# Patient Record
Sex: Female | Born: 1970 | Race: White | Hispanic: No | Marital: Married | Smoking: Former smoker
Health system: Southern US, Community
[De-identification: ages and names within clinical notes are randomized; demographics above are authoritative.]

## PROBLEM LIST (undated history)

## (undated) DIAGNOSIS — F32A Depression, unspecified: Secondary | ICD-10-CM

## (undated) DIAGNOSIS — E859 Amyloidosis, unspecified: Secondary | ICD-10-CM

## (undated) DIAGNOSIS — F419 Anxiety disorder, unspecified: Secondary | ICD-10-CM

## (undated) HISTORY — PX: TONSILLECTOMY: SUR1361

---

## 2020-02-19 DIAGNOSIS — D487 Neoplasm of uncertain behavior of other specified sites: Secondary | ICD-10-CM | POA: Insufficient documentation

## 2020-04-08 DIAGNOSIS — E8581 Light chain (AL) amyloidosis: Secondary | ICD-10-CM | POA: Insufficient documentation

## 2022-03-27 ENCOUNTER — Other Ambulatory Visit: Payer: Self-pay

## 2022-03-27 ENCOUNTER — Encounter (HOSPITAL_COMMUNITY): Payer: Self-pay | Admitting: Emergency Medicine

## 2022-03-27 DIAGNOSIS — S8392XA Sprain of unspecified site of left knee, initial encounter: Secondary | ICD-10-CM | POA: Diagnosis not present

## 2022-03-27 DIAGNOSIS — M25552 Pain in left hip: Secondary | ICD-10-CM | POA: Diagnosis not present

## 2022-03-27 DIAGNOSIS — M549 Dorsalgia, unspecified: Secondary | ICD-10-CM | POA: Diagnosis not present

## 2022-03-27 DIAGNOSIS — S8992XA Unspecified injury of left lower leg, initial encounter: Secondary | ICD-10-CM | POA: Diagnosis present

## 2022-03-27 DIAGNOSIS — Z91040 Latex allergy status: Secondary | ICD-10-CM | POA: Insufficient documentation

## 2022-03-27 DIAGNOSIS — W1830XA Fall on same level, unspecified, initial encounter: Secondary | ICD-10-CM | POA: Diagnosis not present

## 2022-03-27 NOTE — ED Triage Notes (Signed)
Patient c/o multiple sites of pain after falling today. Patient states left foot went though weak spot on wooden step. Per fell backward while leg was in step and hit left hip and right mid. Per husband patient's landed right back fell metal anchor. CNS intact. Denies any complications with BM or urination.

## 2022-03-28 ENCOUNTER — Emergency Department (HOSPITAL_COMMUNITY)
Admission: EM | Admit: 2022-03-28 | Discharge: 2022-03-28 | Disposition: A | Discharge status: Home / Self Care | Payer: BC Managed Care – PPO | Attending: Emergency Medicine | Admitting: Emergency Medicine

## 2022-03-28 ENCOUNTER — Emergency Department (HOSPITAL_COMMUNITY): Payer: BC Managed Care – PPO

## 2022-03-28 DIAGNOSIS — S8392XA Sprain of unspecified site of left knee, initial encounter: Secondary | ICD-10-CM

## 2022-03-28 HISTORY — DX: Amyloidosis, unspecified: E85.9

## 2022-03-28 HISTORY — DX: Depression, unspecified: F32.A

## 2022-03-28 HISTORY — DX: Anxiety disorder, unspecified: F41.9

## 2022-03-28 MED ORDER — IBUPROFEN 800 MG PO TABS: 800.0000 mg | ORAL_TABLET | Freq: Four times a day (QID) | ORAL | 0 refills | Status: AC | PRN

## 2022-03-28 NOTE — ED Notes (Signed)
Patient transported to X-ray 

## 2022-03-28 NOTE — ED Provider Notes (Signed)
Stacey Randall Provider Note   CSN: [id number] Arrival date & time: 03/27/22  1713     History  Chief Complaint  Patient presents with   Lytle Michaels    Stacey Randall is a 51 y.o. female.  Patient presents to the emergency department for evaluation of leg and back injury.  Patient reports that she stepped onto a wooden step and her leg fell through the step causing her to fall backwards.  She then hit the center of her mid back on a metal object that was on the ground.  Patient complaining of left knee, left hip and back pain after the fall.  No head injury.       Home Medications Prior to Admission medications   Medication Sig Start Date End Date Taking? Authorizing Provider  ibuprofen (ADVIL) 800 MG tablet Take 1 tablet (800 mg total) by mouth every 6 (six) hours as needed for moderate pain. 03/28/22  Yes Jamice Carreno, Gwenyth Allegra, MD  triamterene-hydrochlorothiazide (MAXZIDE-25) 37.5-25 MG tablet Take 0.5-1 tablets by mouth daily as needed. 02/04/20  Yes [provider]  Ascorbic Acid (VITAMIN C) 1000 MG tablet Take by mouth.    [provider]  Biotin 1 MG CAPS Take by mouth.    [provider]  cephALEXin (KEFLEX) 500 MG capsule Take 500 mg by mouth 2 (two) times daily. 10/08/21   [provider]  citalopram (CELEXA) 20 MG tablet Take 20 mg by mouth daily. 02/25/22   [provider]  topiramate (TOPAMAX) 25 MG tablet Take 25 mg by mouth at bedtime. 02/25/22   [provider]      Allergies    Erythromycin, Latex, Coly-mycin s [colistin], E.p. mycin [oxytetracycline], and Hydrocortisone    Review of Systems   Review of Systems  Physical Exam Updated Vital Signs BP 121/63   Pulse 68   Temp 98.3 F (36.8 C) (Oral)   Resp 16   Ht 5' 9.5" (1.765 m)   Wt 117.9 kg   SpO2 100%   BMI 37.84 kg/m  Physical Exam Vitals and nursing note reviewed.  Constitutional:      General: She is not in acute distress.     Appearance: She is well-developed.  HENT:     Head: Normocephalic and atraumatic.     Mouth/Throat:     Mouth: Mucous membranes are moist.  Eyes:     General: Vision grossly intact. Gaze aligned appropriately.     Extraocular Movements: Extraocular movements intact.     Conjunctiva/sclera: Conjunctivae normal.  Cardiovascular:     Rate and Rhythm: Normal rate and regular rhythm.     Pulses: Normal pulses.     Heart sounds: Normal heart sounds, S1 normal and S2 normal. No murmur heard.    No friction rub. No gallop.  Pulmonary:     Effort: Pulmonary effort is normal. No respiratory distress.     Breath sounds: Normal breath sounds.  Abdominal:     General: Bowel sounds are normal.     Palpations: Abdomen is soft.     Tenderness: There is no abdominal tenderness. There is no guarding or rebound.     Hernia: No hernia is present.  Musculoskeletal:        General: No swelling.     Cervical back: Full passive range of motion without pain, normal range of motion and neck supple. No spinous process tenderness or muscular tenderness. Normal range of motion.       Back:  Left hip: Tenderness present. No deformity. Normal range of motion.     Left knee: No swelling, deformity, effusion, erythema or ecchymosis. Decreased range of motion. Tenderness present. No patellar tendon tenderness. Normal alignment. Normal pulse.     Instability Tests: Anterior drawer test negative. Posterior drawer test negative.     Right lower leg: No edema.     Left lower leg: No edema.  Skin:    General: Skin is warm and dry.     Capillary Refill: Capillary refill takes less than 2 seconds.     Findings: No ecchymosis, erythema, rash or wound.  Neurological:     General: No focal deficit present.     Mental Status: She is alert and oriented to person, place, and time.     GCS: GCS eye subscore is 4. GCS verbal subscore is 5. GCS motor subscore is 6.     Cranial Nerves: Cranial nerves 2-12 are intact.      Sensory: Sensation is intact.     Motor: Motor function is intact.     Coordination: Coordination is intact.  Psychiatric:        Attention and Perception: Attention normal.        Mood and Affect: Mood normal.        Speech: Speech normal.        Behavior: Behavior normal.     ED Results / Procedures / Treatments   Labs (all labs ordered are listed, but only abnormal results are displayed) Labs Reviewed - No data to display  EKG None  Radiology CT Thoracic Spine Wo Contrast  Addendum Date: 03/28/2022   ADDENDUM REPORT: 03/28/2022 05:50 ADDENDUM: There should also be an Impression #3 which reads: 3. Heterogeneously and enlarged thyroid. Recommend thyroid ultrasound (ref: J Am Coll Radiol. 2015 Feb;12(2): 143-50). Electronically Signed   By: Genevie Ann M.D.   On: 03/28/2022 05:50   Result Date: 03/28/2022 CLINICAL DATA:  51 year old female status post fall with pain. Suspicion of acute lower thoracic anterior wedge compression on lumbar radiographs today. EXAM: CT THORACIC SPINE WITHOUT CONTRAST TECHNIQUE: Multidetector CT images of the thoracic were obtained using the standard protocol without intravenous contrast. RADIATION DOSE REDUCTION: This exam was performed according to the departmental dose-optimization program which includes automated exposure control, adjustment of the mA and/or kV according to patient size and/or use of iterative reconstruction technique. COMPARISON:  Lumbar radiographs 03/28/2022. FINDINGS: Limited cervical spine imaging: Cervicothoracic junction alignment is within normal limits. Thoracic spine segmentation:  Normal. Alignment: Mild dextroconvex thoracic kyphoscoliosis. No significant thoracic spondylolisthesis. Vertebrae: Mild anterior wedging of T11 appears to be chronic and/or congenital. Otherwise maintained thoracic vertebral body height throughout. Mild thoracic endplate degeneration T4 through T12. No acute thoracic vertebral fracture identified. L1 vertebra  also appears intact. Visible posterior ribs appear intact. Paraspinal and other soft tissues: Thoracic paraspinal soft tissues are within normal limits. Heterogeneous enlargement of the thyroid, greater on the right. Negative visible noncontrast mediastinum. Questionable small gastric hiatal hernia. No evidence of pericardial or pleural effusion. Visible major airways are patent. Visible lungs are clear with mild respiratory motion. Negative visible noncontrast upper abdominal viscera. Normal caliber of the visible aorta in the chest and abdomen. Disc levels: Thoracic kyphoscoliosis and lower thoracic disc degeneration with vacuum disc, but no CT evidence of significant thoracic spinal stenosis. IMPRESSION: 1. No convincing acute traumatic injury identified in the thoracic spine. Suspect chronic and/or congenital mild anterior wedging of T11. If there is ongoing suspicion of occult compression  fracture then noncontrast Thoracic MRI or Nuclear Medicine Whole-body Bone Scan would evaluate with the highest sensitivity. 2. Thoracic kyphoscoliosis with some disc and endplate degeneration but no CT evidence of significant thoracic spinal stenosis. Electronically Signed: By: Genevie Ann M.D. On: 03/28/2022 05:10   DG Hip Unilat W or Wo Pelvis 2-3 Views Left  Result Date: 03/28/2022 CLINICAL DATA:  Fall and trauma to the left lower extremity. EXAM: DG HIP (WITH OR WITHOUT PELVIS) 2-3V LEFT; LEFT KNEE - COMPLETE 4+ VIEW COMPARISON:  None Available. FINDINGS: There is no evidence of hip fracture or dislocation. There is no evidence of arthropathy or other focal bone abnormality. IMPRESSION: Negative. Electronically Signed   By: Anner Crete M.D.   On: 03/28/2022 02:12   DG Knee Complete 4 Views Left  Result Date: 03/28/2022 CLINICAL DATA:  Fall and trauma to the left lower extremity. EXAM: DG HIP (WITH OR WITHOUT PELVIS) 2-3V LEFT; LEFT KNEE - COMPLETE 4+ VIEW COMPARISON:  None Available. FINDINGS: There is no  evidence of hip fracture or dislocation. There is no evidence of arthropathy or other focal bone abnormality. IMPRESSION: Negative. Electronically Signed   By: Anner Crete M.D.   On: 03/28/2022 02:12   DG Lumbar Spine Complete  Result Date: 03/28/2022 CLINICAL DATA:  Pain after a fall. EXAM: LUMBAR SPINE - COMPLETE 4+ VIEW COMPARISON:  None Available. FINDINGS: Five lumbar type vertebral bodies. Normal alignment of the lumbar spine. Mild degenerative changes with disc space narrowing and small osteophyte formation. There is anterior compression of the T11 vertebra, likely acute in the given clinical setting. No apparent retropulsion of fracture fragments. Visualized sacrum appears intact. IMPRESSION: 1. Anterior compression of the T11 vertebra, likely acute. 2. Mild degenerative changes in the lumbar spine. Electronically Signed   By: Lucienne Capers M.D.   On: 03/28/2022 02:11    Procedures Procedures    Medications Ordered in ED Medications - No data to display  ED Course/ Medical Decision Making/ A&P                           Medical Decision Making Amount and/or Complexity of Data Reviewed Radiology: ordered.   Patient presents with left knee and hip pain after a fall.  X-ray of knee and hip are negative.  Immobilize knee, follow-up with orthopedics.  Patient also complained of pain in the middle of the back.  X-ray was suspicious for possible thoracic fracture but CT scan shows that this is chronic or congenital.  No further work-up necessary.        Final Clinical Impression(s) / ED Diagnoses Final diagnoses:  Sprain of left knee, unspecified ligament, initial encounter    Rx / DC Orders ED Discharge Orders          Ordered    ibuprofen (ADVIL) 800 MG tablet  Every 6 hours PRN        03/28/22 0559    Ambulatory referral to Orthopedic Surgery        03/28/22 0559              Orpah Greek, MD 03/28/22 [phone]

## 2022-03-28 NOTE — ED Notes (Signed)
ED Provider at bedside. 

## 2022-03-28 NOTE — ED Notes (Signed)
Patient transported to CT 

## 2022-12-07 IMAGING — DX DG HIP (WITH OR WITHOUT PELVIS) 2-3V*L*
3 series · 3 of 3 positions shown · non-contrast
Comparison: None Available.

CLINICAL DATA: Fall and trauma to the left lower extremity.

EXAM:
DG HIP (WITH OR WITHOUT PELVIS) 2-3V LEFT; LEFT KNEE - COMPLETE 4+
VIEW

[pelvis ap]
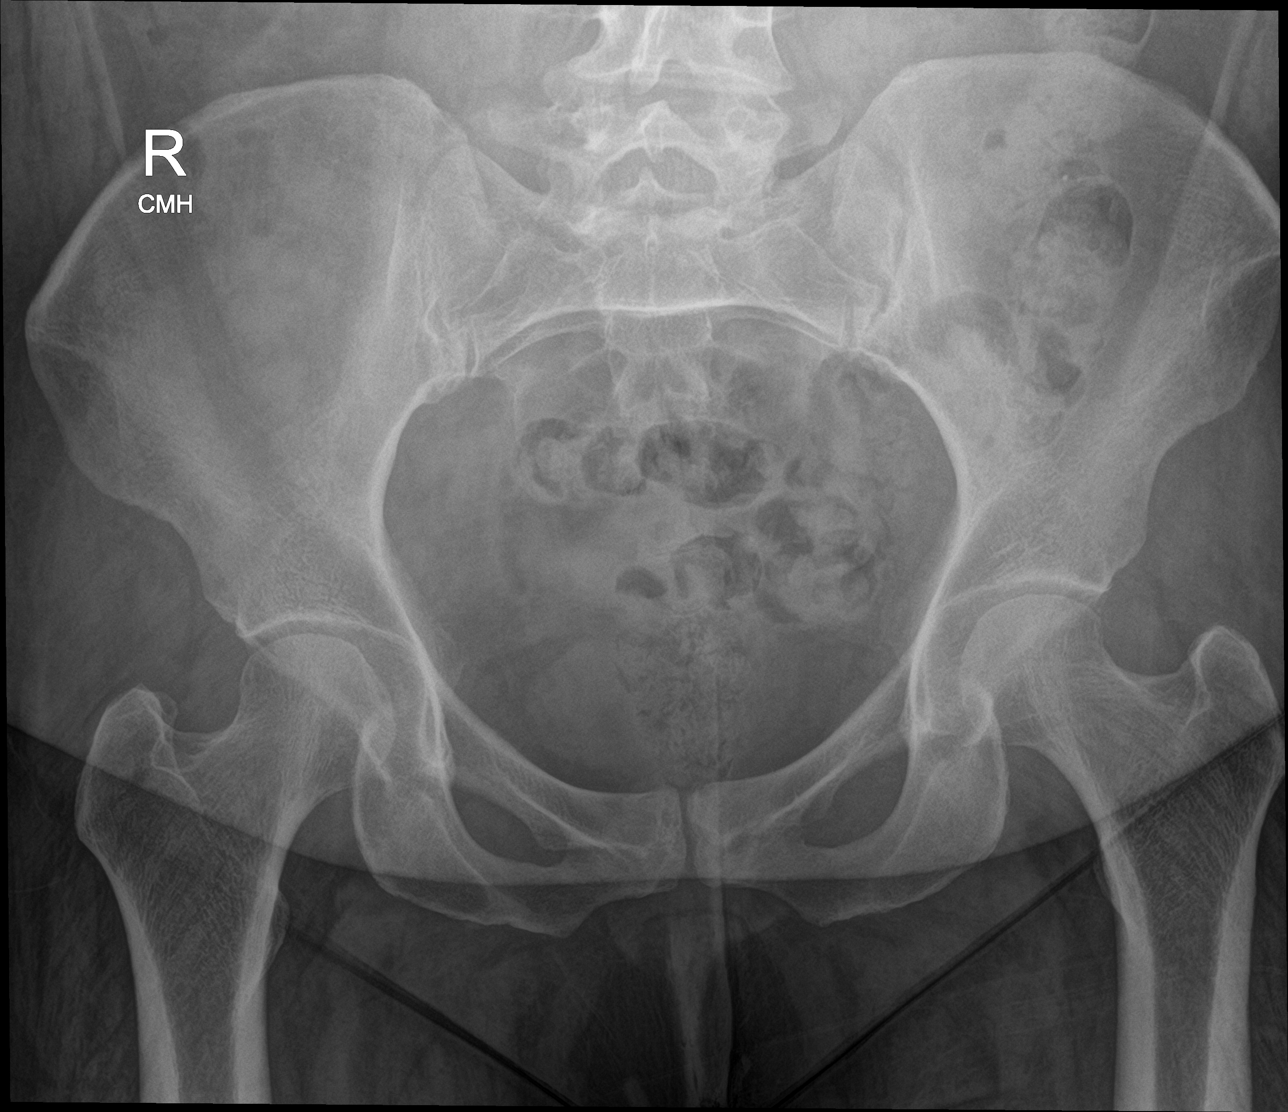

[hip ap]
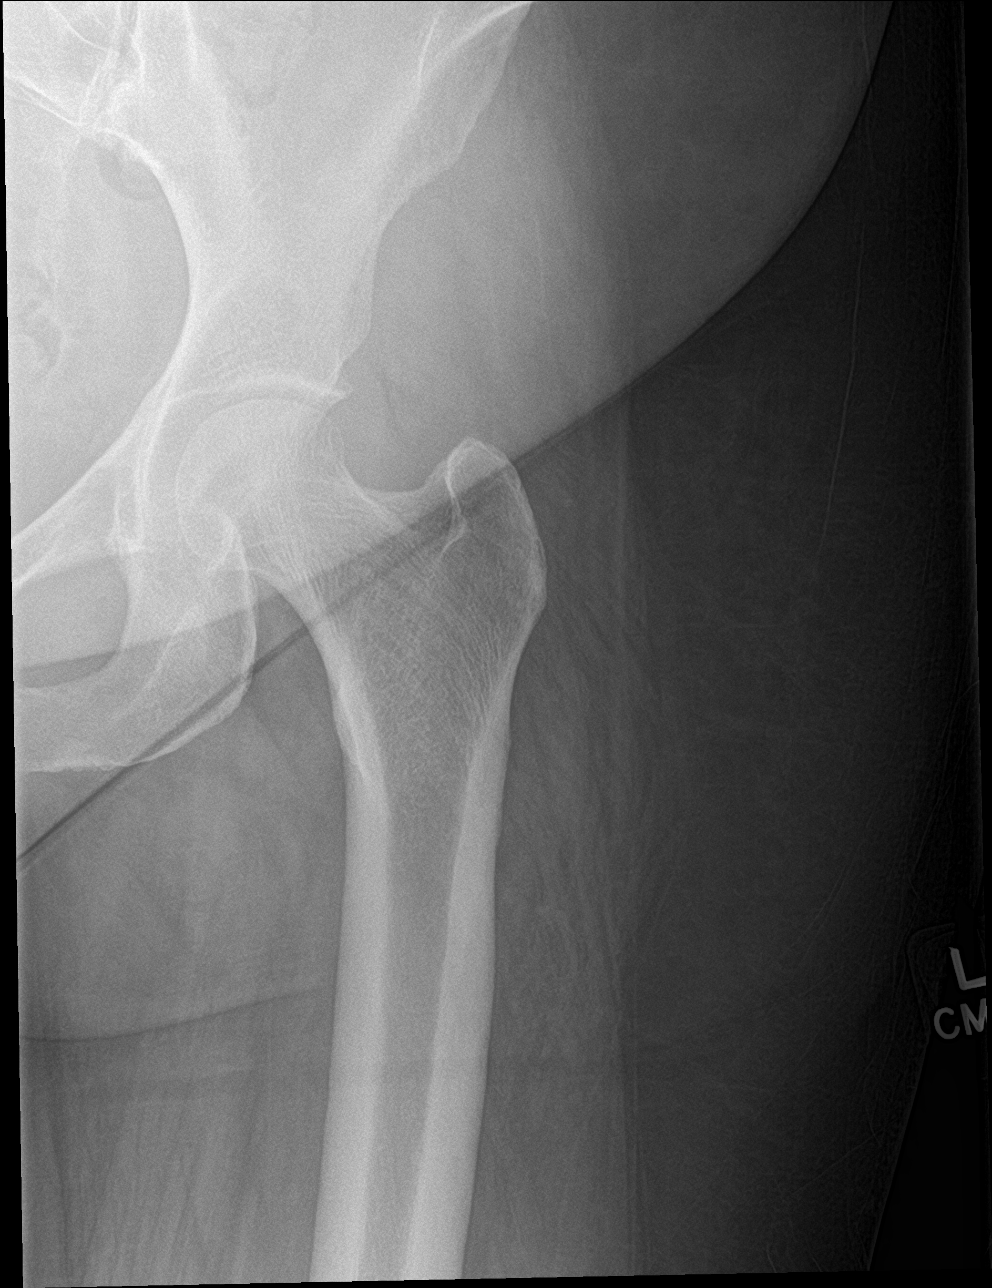

[hip lat]
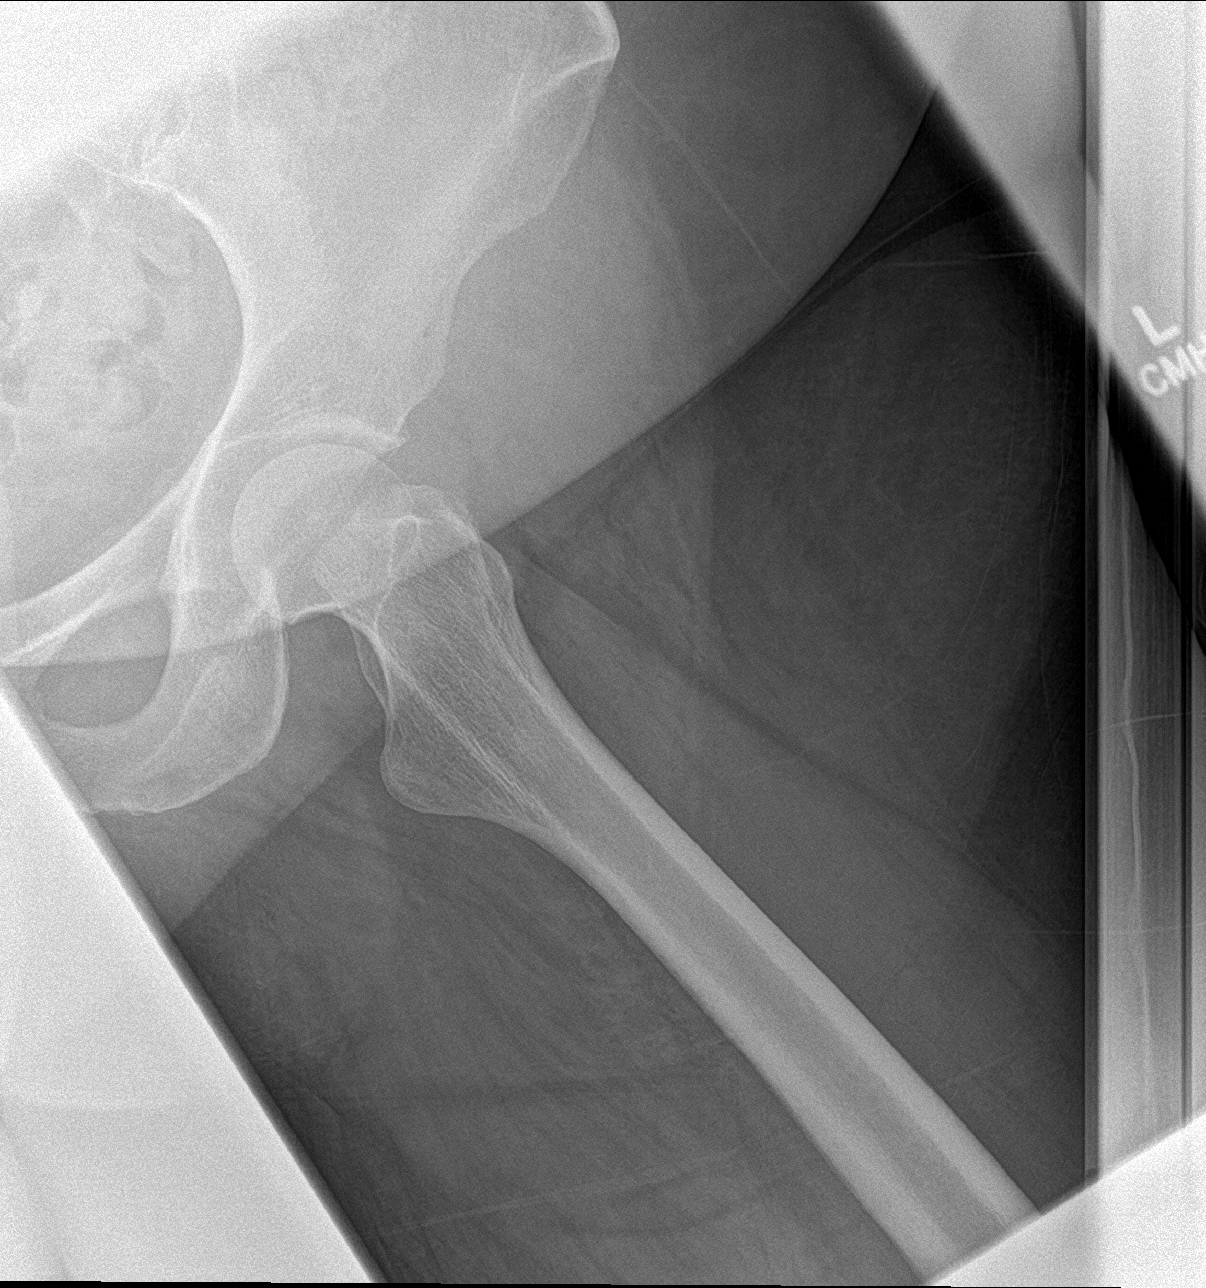

[3 of 3 positions shown; findings below may reference images not displayed]

FINDINGS: There is no evidence of hip fracture or dislocation. There is no
evidence of arthropathy or other focal bone abnormality.
IMPRESSION: Negative.
